# Patient Record
Sex: Female | Born: 1985 | Race: Black or African American | Hispanic: No | Marital: Single | Smoking: Former smoker
Health system: Southern US, Community
[De-identification: ages and names within clinical notes are randomized; demographics above are authoritative.]

---

## 2009-05-28 ENCOUNTER — Ambulatory Visit: Payer: Self-pay | Admitting: Diagnostic Radiology

## 2009-05-28 ENCOUNTER — Emergency Department (HOSPITAL_BASED_OUTPATIENT_CLINIC_OR_DEPARTMENT_OTHER): Admission: EM | Admit: 2009-05-28 | Discharge: 2009-05-28 | Payer: Self-pay | Admitting: Emergency Medicine

## 2010-05-14 LAB — URINALYSIS, ROUTINE W REFLEX MICROSCOPIC
Glucose, UA: NEGATIVE mg/dL
Protein, ur: NEGATIVE mg/dL
Urobilinogen, UA: 0.2 mg/dL (ref 0.0–1.0)
pH: 7 (ref 5.0–8.0)

## 2010-05-14 LAB — WET PREP, GENITAL
Trich, Wet Prep: NONE SEEN
WBC, Wet Prep HPF POC: NONE SEEN
Yeast Wet Prep HPF POC: NONE SEEN

## 2010-05-14 LAB — PREGNANCY, URINE: Preg Test, Ur: POSITIVE

## 2010-05-14 LAB — GC/CHLAMYDIA PROBE AMP, GENITAL
Chlamydia, DNA Probe: NEGATIVE
GC Probe Amp, Genital: NEGATIVE

## 2013-01-13 ENCOUNTER — Emergency Department (HOSPITAL_BASED_OUTPATIENT_CLINIC_OR_DEPARTMENT_OTHER)
Admission: EM | Admit: 2013-01-13 | Discharge: 2013-01-14 | Disposition: A | Discharge status: Home / Self Care | Payer: Medicaid Other | Attending: Emergency Medicine | Admitting: Emergency Medicine

## 2013-01-13 ENCOUNTER — Encounter (HOSPITAL_BASED_OUTPATIENT_CLINIC_OR_DEPARTMENT_OTHER): Payer: Self-pay | Admitting: Emergency Medicine

## 2013-01-13 DIAGNOSIS — L03039 Cellulitis of unspecified toe: Secondary | ICD-10-CM | POA: Insufficient documentation

## 2013-01-13 DIAGNOSIS — F17200 Nicotine dependence, unspecified, uncomplicated: Secondary | ICD-10-CM | POA: Insufficient documentation

## 2013-01-13 DIAGNOSIS — L02619 Cutaneous abscess of unspecified foot: Secondary | ICD-10-CM | POA: Insufficient documentation

## 2013-01-13 DIAGNOSIS — B353 Tinea pedis: Secondary | ICD-10-CM | POA: Insufficient documentation

## 2013-01-13 DIAGNOSIS — L0390 Cellulitis, unspecified: Secondary | ICD-10-CM

## 2013-01-13 MED ORDER — SULFAMETHOXAZOLE-TRIMETHOPRIM 800-160 MG PO TABS: 1.0000 | ORAL_TABLET | Freq: Two times a day (BID) | ORAL | Status: DC

## 2013-01-13 MED ORDER — TRAMADOL HCL 50 MG PO TABS
50.0000 mg | ORAL_TABLET | Freq: Once | ORAL | Status: AC
  Administered 2013-01-13: 50 mg via ORAL
  Filled 2013-01-13: qty 1

## 2013-01-13 MED ORDER — SULFAMETHOXAZOLE-TMP DS 800-160 MG PO TABS
1.0000 | ORAL_TABLET | Freq: Once | ORAL | Status: AC
  Administered 2013-01-13: 1 via ORAL
  Filled 2013-01-13: qty 1

## 2013-01-13 MED ORDER — TRAMADOL HCL 50 MG PO TABS: 50.0000 mg | ORAL_TABLET | Freq: Four times a day (QID) | ORAL | Status: DC | PRN

## 2013-01-13 NOTE — ED Notes (Signed)
Pain  And redness to rt foot x 5 days   No known injuyr

## 2013-01-13 NOTE — ED Notes (Signed)
MD at bedside. 

## 2013-01-13 NOTE — ED Provider Notes (Signed)
CSN: [id number]     Arrival date & time 01/13/13  2322 History  This chart was scribed for Pace Lamadrid Smitty Cords, MD by Ardelia Mems, ED Scribe. This patient was seen in room MH06/MH06 and the patient's care was started at 11:39 PM.   Chief Complaint  Patient presents with  . Foot Pain    Patient is a 27 y.o. female presenting with lower extremity pain. The history is provided by the patient. No language interpreter was used.  Foot Pain This is a new problem. The current episode started more than 2 days ago (5 days ago). The problem occurs rarely. The problem has been gradually worsening. Pertinent negatives include no chest pain, no abdominal pain, no headaches and no shortness of breath. The symptoms are aggravated by walking. Nothing relieves the symptoms. Treatments tried: warm Epsom's salt bath. The treatment provided no relief.    HPI Comments: Angelica Sandoval is a 27 y.o. female who presents to the Emergency Department complaining of intermittent, mild right dorsal foot pain, mainly at the base of her 4th and 5th toes, over the past 5 days. She describes her pain as "soreness", and as "burninig" when she iswalking. She reports associated redness to the foot. She denies any known injury to have onset this pain. She states that she has soaked the right foot in a warm Epsom's salt bath without relief. She denies any other pain or symptoms.   History reviewed. No pertinent past medical history. No past surgical history on file. No family history on file. History  Substance Use Topics  . Smoking status: Light Tobacco Smoker  . Smokeless tobacco: Not on file  . Alcohol Use: Yes   OB History   Grav Para Term Preterm Abortions TAB SAB Ect Mult Living                 Review of Systems  Constitutional: Negative for fever.  Respiratory: Negative for shortness of breath.   Cardiovascular: Negative for chest pain.  Gastrointestinal: Negative for abdominal pain.  Musculoskeletal:        Right foot pain  Neurological: Negative for headaches.  All other systems reviewed and are negative.   Allergies  Review of patient's allergies indicates no known allergies.  Home Medications   Current Outpatient Rx  Name  Route  Sig  Dispense  Refill  . sulfamethoxazole-trimethoprim (BACTRIM DS,SEPTRA DS) 800-160 MG per tablet   Oral   Take 1 tablet by mouth 2 (two) times daily. One po bid x 7 days   6 tablet   0   . traMADol (ULTRAM) 50 MG tablet   Oral   Take 1 tablet (50 mg total) by mouth every 6 (six) hours as needed.   9 tablet   0     Triage Vitals: BP 121/71  Pulse 102  Temp(Src) 98.8 F (37.1 C) (Oral)  Resp 16  Ht 5\' 5"  (1.651 m)  Wt 165 lb (74.844 kg)  BMI 27.46 kg/m2  SpO2 99%  Physical Exam  Nursing note and vitals reviewed. Constitutional: She is oriented to person, place, and time. She appears well-developed and well-nourished. No distress.  HENT:  Head: Normocephalic and atraumatic.  Mouth/Throat: Oropharynx is clear and moist.  Moist mucous membranes.  Eyes: EOM are normal. Pupils are equal, round, and reactive to light.  Neck: Normal range of motion. Neck supple. No tracheal deviation present.  Cardiovascular: Normal rate, regular rhythm and intact distal pulses.   Intact DP pulse. Cap refill less  than 2 seconds for the right foot.  Pulmonary/Chest: Effort normal and breath sounds normal. No respiratory distress. She has no wheezes. She has no rales. She exhibits no tenderness.  Abdominal: Soft. Bowel sounds are normal. There is no tenderness. There is no rebound and no guarding.  Musculoskeletal: Normal range of motion.  Neurological: She is alert and oriented to person, place, and time.  Right foot NVI.  Skin: Skin is warm and dry.  1.5 inch area of redness/cellulitis cracking between the 4th and 5th toes on the right foot, probably brought in by the cracking between the toes which has some athlete's foot.  Psychiatric: She has a normal mood  and affect. Her behavior is normal.    ED Course  Procedures (including critical care time)  DIAGNOSTIC STUDIES: Oxygen Saturation is 99% on RA, normal by my interpretation.    COORDINATION OF CARE: 11:45 PM- Discussed clinical suspicion that pt has athlete's foot. Advised pt on how to manage her symptoms at home. Pt advised of plan for treatment and pt agrees.  Labs Review Labs Reviewed - No data to display Imaging Review No results found.  EKG Interpretation   None       MDM   1. Cellulitis   2. Athlete's foot on right    Will treat with antibiotics and have advised anti fungal spray.  Return for fevers worsening swelling or redness or any concerns   I personally performed the services described in this documentation, which was scribed in my presence. The recorded information has been reviewed and is accurate.     Jasmine Awe, MD 01/14/13 [phone]

## 2014-06-01 ENCOUNTER — Encounter (HOSPITAL_BASED_OUTPATIENT_CLINIC_OR_DEPARTMENT_OTHER): Payer: Self-pay | Admitting: *Deleted

## 2014-06-01 ENCOUNTER — Emergency Department (HOSPITAL_BASED_OUTPATIENT_CLINIC_OR_DEPARTMENT_OTHER)
Admission: EM | Admit: 2014-06-01 | Discharge: 2014-06-01 | Disposition: A | Discharge status: Home / Self Care | Payer: BC Managed Care – PPO | Attending: Emergency Medicine | Admitting: Emergency Medicine

## 2014-06-01 DIAGNOSIS — Z87891 Personal history of nicotine dependence: Secondary | ICD-10-CM | POA: Insufficient documentation

## 2014-06-01 DIAGNOSIS — Z3202 Encounter for pregnancy test, result negative: Secondary | ICD-10-CM | POA: Insufficient documentation

## 2014-06-01 DIAGNOSIS — R112 Nausea with vomiting, unspecified: Secondary | ICD-10-CM | POA: Insufficient documentation

## 2014-06-01 DIAGNOSIS — Z792 Long term (current) use of antibiotics: Secondary | ICD-10-CM | POA: Diagnosis not present

## 2014-06-01 DIAGNOSIS — R42 Dizziness and giddiness: Secondary | ICD-10-CM | POA: Diagnosis present

## 2014-06-01 LAB — URINALYSIS, ROUTINE W REFLEX MICROSCOPIC
BILIRUBIN URINE: NEGATIVE
Glucose, UA: NEGATIVE mg/dL
Ketones, ur: NEGATIVE mg/dL
Leukocytes, UA: NEGATIVE
NITRITE: NEGATIVE
PH: 7.5 (ref 5.0–8.0)
Protein, ur: NEGATIVE mg/dL
SPECIFIC GRAVITY, URINE: 1.021 (ref 1.005–1.030)
Urobilinogen, UA: 1 mg/dL (ref 0.0–1.0)

## 2014-06-01 LAB — URINE MICROSCOPIC-ADD ON

## 2014-06-01 LAB — PREGNANCY, URINE: Preg Test, Ur: NEGATIVE

## 2014-06-01 MED ORDER — SODIUM CHLORIDE 0.9 % IV BOLUS (SEPSIS): 1000.0000 mL | Freq: Once | INTRAVENOUS | Status: DC

## 2014-06-01 NOTE — ED Provider Notes (Signed)
CSN: [id number]     Arrival date & time 06/01/14  1134 History   First MD Initiated Contact with Patient 06/01/14 1241     Chief Complaint  Patient presents with  . Dizziness     (Consider location/radiation/quality/duration/timing/severity/associated sxs/prior Treatment) HPI  This is a [age] year old female who presents with dizziness, nausea, and vomiting. Patient had one episode of nausea and emesis while at work. Emesis was nonbloody, non-bilious. Patient states that she felt very hot during that time. She reports feeling off balance but no room spinning dizziness. First responders took her blood pressure and found her blood pressure to be 151/103 following emesis. Patient denies any recent illnesses, fevers, chest pain, shortness breath, abdominal pain, urinary symptoms. Last menstrual period was the third week in March.  Patient reports that she is now back to her baseline. No known history of high blood pressure. Blood pressure in triage 126/75.  History reviewed. No pertinent past medical history. Past Surgical History  Procedure Laterality Date  . Cesarean section     No family history on file. History  Substance Use Topics  . Smoking status: Former Games developermoker  . Smokeless tobacco: Not on file  . Alcohol Use: Yes   OB History    No data available     Review of Systems  Constitutional: Negative for fever.  Respiratory: Negative for cough, chest tightness and shortness of breath.   Cardiovascular: Negative for chest pain.  Gastrointestinal: Positive for nausea and vomiting. Negative for abdominal pain.  Genitourinary: Negative for dysuria and frequency.  Musculoskeletal: Negative for back pain.  Neurological: Positive for dizziness. Negative for headaches.  Psychiatric/Behavioral: Negative for confusion.  All other systems reviewed and are negative.     Allergies  Review of patient's allergies indicates no known allergies.  Home Medications   Prior to Admission  medications   Medication Sig Start Date End Date Taking? Authorizing Provider  sulfamethoxazole-trimethoprim (BACTRIM DS,SEPTRA DS) 800-160 MG per tablet Take 1 tablet by mouth 2 (two) times daily. One po bid x 7 days 01/13/13   April Palumbo, MD  traMADol (ULTRAM) 50 MG tablet Take 1 tablet (50 mg total) by mouth every 6 (six) hours as needed. 01/13/13   April Palumbo, MD   BP 133/88 mmHg  Pulse 67  Temp(Src) 98.2 F (36.8 C) (Oral)  Resp 16  Ht 5\' 5"  (1.651 m)  Wt 174 lb (78.926 kg)  BMI 28.96 kg/m2  SpO2 100%  LMP 05/15/2014 (Approximate) Physical Exam  Constitutional: She is oriented to person, place, and time. She appears well-developed and well-nourished. No distress.  HENT:  Head: Normocephalic and atraumatic.  Eyes: Pupils are equal, round, and reactive to light.  Cardiovascular: Normal rate, regular rhythm and normal heart sounds.   No murmur heard. Pulmonary/Chest: Effort normal and breath sounds normal. No respiratory distress. She has no wheezes.  Abdominal: Soft. Bowel sounds are normal. There is no tenderness. There is no rebound.  Neurological: She is alert and oriented to person, place, and time.  No dysmetria to finger-nose-finger, 5 out of 5 strength in all 4 extremities  Skin: Skin is warm and dry.  Psychiatric: She has a normal mood and affect.  Nursing note and vitals reviewed.   ED Course  Procedures (including critical care time) Labs Review Labs Reviewed  URINALYSIS, ROUTINE W REFLEX MICROSCOPIC - Abnormal; Notable for the following:    APPearance TURBID (*)    Hgb urine dipstick TRACE (*)    All other components within normal  limits  PREGNANCY, URINE  URINE MICROSCOPIC-ADD ON    Imaging Review No results found.   EKG Interpretation   Date/Time:  Friday June 01 2014 13:54:18 EDT Ventricular Rate:  68 PR Interval:  122 QRS Duration: 84 QT Interval:  394 QTC Calculation: 418 R Axis:   43 Text Interpretation:  Sinus rhythm with occasional  Premature ventricular  complexes Otherwise normal ECG ED PHYSICIAN INTERPRETATION AVAILABLE IN  [facility] Confirmed by TEST, Record ([id number]) on 06/02/2014 9:39:38 AM      MDM   Final diagnoses:  Dizziness    Patient presents with nausea, vomiting, and one episode of dizziness. Episode appears self-limited. Blood pressure following event was elevated and patient was instructed to come to the ER. Repeat blood pressure here is normal. Patient is currently asymptomatic. Physical exam is benign and neurologic exam is reassuring. Patient is not orthostatic. EKG shows no evidence of arrhythmia or interval prolongation. Patient is not pregnant. 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 Have offered the patient fluids. She is refusing fluids and wants to drink by mouth.  After history, exam, and medical workup I feel the patient has been appropriately medically screened and is safe for discharge home. Pertinent diagnoses were discussed with the patient. Patient was given return precautions.     Shon Baton, MD 06/04/14 [phone]

## 2014-06-01 NOTE — ED Notes (Signed)
Pt refusing IV. Pt states she just wants to drink water

## 2014-06-01 NOTE — Discharge Instructions (Signed)

## 2014-06-01 NOTE — ED Notes (Signed)
Pt sts she was at work when she suddenly became nausea and had 1 episode of emesis. She sts that she has been intermittently dizzy and has had intermittent chills. She sts they took her bp and found it to be 151/103.

## 2014-12-24 ENCOUNTER — Encounter (HOSPITAL_BASED_OUTPATIENT_CLINIC_OR_DEPARTMENT_OTHER): Payer: Self-pay

## 2014-12-24 ENCOUNTER — Emergency Department (HOSPITAL_BASED_OUTPATIENT_CLINIC_OR_DEPARTMENT_OTHER)
Admission: EM | Admit: 2014-12-24 | Discharge: 2014-12-24 | Disposition: A | Discharge status: Home / Self Care | Payer: BC Managed Care – PPO | Attending: Emergency Medicine | Admitting: Emergency Medicine

## 2014-12-24 DIAGNOSIS — Y9389 Activity, other specified: Secondary | ICD-10-CM | POA: Insufficient documentation

## 2014-12-24 DIAGNOSIS — Y9289 Other specified places as the place of occurrence of the external cause: Secondary | ICD-10-CM | POA: Insufficient documentation

## 2014-12-24 DIAGNOSIS — Y998 Other external cause status: Secondary | ICD-10-CM | POA: Insufficient documentation

## 2014-12-24 DIAGNOSIS — Z23 Encounter for immunization: Secondary | ICD-10-CM | POA: Insufficient documentation

## 2014-12-24 DIAGNOSIS — W228XXA Striking against or struck by other objects, initial encounter: Secondary | ICD-10-CM | POA: Insufficient documentation

## 2014-12-24 DIAGNOSIS — F17200 Nicotine dependence, unspecified, uncomplicated: Secondary | ICD-10-CM | POA: Insufficient documentation

## 2014-12-24 DIAGNOSIS — S61011A Laceration without foreign body of right thumb without damage to nail, initial encounter: Secondary | ICD-10-CM | POA: Insufficient documentation

## 2014-12-24 MED ORDER — LIDOCAINE HCL (PF) 1 % IJ SOLN
10.0000 mL | Freq: Once | INTRAMUSCULAR | Status: AC
  Administered 2014-12-24: 10 mL
  Filled 2014-12-24: qty 10

## 2014-12-24 MED ORDER — TETANUS-DIPHTH-ACELL PERTUSSIS 5-2.5-18.5 LF-MCG/0.5 IM SUSP
0.5000 mL | Freq: Once | INTRAMUSCULAR | Status: AC
  Administered 2014-12-24: 0.5 mL via INTRAMUSCULAR
  Filled 2014-12-24: qty 0.5

## 2014-12-24 NOTE — ED Provider Notes (Signed)
CSN: [id number]     Arrival date & time 12/24/14  1321 History   First MD Initiated Contact with Patient 12/24/14 1411     Chief Complaint  Patient presents with  . Finger Injury    HPI   Angelica Sandoval is a 29 y.o. female with no pertinent PMH who presents to the ED with right thumb laceration. She states she was cleaning out her car and hit her right thumb. She reports associated pain. She denies exacerbating factors. She has not tried anything for symptom relief. She denies significant blood loss. She denies numbness, weakness, paresthesia. She states she is unsure if her tetanus is up-to-date.   History reviewed. No pertinent past medical history. Past Surgical History  Procedure Laterality Date  . Cesarean section     No family history on file. Social History  Substance Use Topics  . Smoking status: Current Every Day Smoker  . Smokeless tobacco: None  . Alcohol Use: Yes     Comment: occ   OB History    No data available      Review of Systems  Skin: Positive for wound.  Neurological: Negative for weakness and numbness.      Allergies  Review of patient's allergies indicates no known allergies.  Home Medications   Prior to Admission medications   Not on File    BP 128/82 mmHg  Pulse 80  Temp(Src) 98.2 F (36.8 C) (Oral)  Resp 16  Ht  (1.651 m)  Wt 171 lb (77.565 kg)  BMI 28.46 kg/m2  SpO2 100%  LMP  (LMP Unknown) Physical Exam  Constitutional: She is oriented to person, place, and time. She appears well-developed and well-nourished. No distress.  HENT:  Head: Normocephalic and atraumatic.  Right Ear: External ear normal.  Left Ear: External ear normal.  Nose: Nose normal.  Mouth/Throat: Oropharynx is clear and moist.  Eyes: Conjunctivae and EOM are normal. Pupils are equal, round, and reactive to light. Right eye exhibits no discharge. Left eye exhibits no discharge. No scleral icterus.  Neck: Normal range of motion. Neck supple.   Cardiovascular: Normal rate and regular rhythm.   Pulmonary/Chest: Effort normal and breath sounds normal. No respiratory distress.  Musculoskeletal: Normal range of motion. She exhibits tenderness. She exhibits no edema.  2 cm laceration to right thumb over PIP, hemostatic. Mild tenderness to palpation. Full range of motion of right hand. Strength and sensation intact. Distal pulses intact. Cap refill less than 3 seconds.  Neurological: She is alert and oriented to person, place, and time.  Skin: Skin is warm and dry. She is not diaphoretic.  Psychiatric: She has a normal mood and affect. Her behavior is normal.  Nursing note and vitals reviewed.   ED Course  .Marland KitchenLaceration Repair Date/Time: 12/24/2014 3:18 PM Performed by: Mady Gemma Authorized by: Glean Hess C Consent: Verbal consent obtained. Risks and benefits: risks, benefits and alternatives were discussed Consent given by: patient Patient understanding: patient states understanding of the procedure being performed Patient consent: the patient's understanding of the procedure matches consent given Procedure consent: procedure consent matches procedure scheduled Relevant documents: relevant documents present and verified Site marked: the operative site was marked Required items: required blood products, implants, devices, and special equipment available Patient identity confirmed: verbally with patient Time out: Immediately prior to procedure a "time out" was called to verify the correct patient, procedure, equipment, support staff and site/side marked as required. Body area: upper extremity Location details: right thumb Laceration length:  2 cm Foreign bodies: no foreign bodies Tendon involvement: none Nerve involvement: none Vascular damage: no Anesthesia: local infiltration Local anesthetic: lidocaine 1% without epinephrine Anesthetic total: 6 ml Patient sedated: no Preparation: Patient was prepped and  draped in the usual sterile fashion. Irrigation solution: saline Irrigation method: tap Amount of cleaning: standard Debridement: none Degree of undermining: none Skin closure: 4-0 Prolene Number of sutures: 2 Technique: simple Approximation: close Approximation difficulty: simple Dressing: 4x4 sterile gauze Patient tolerance: Patient tolerated the procedure well with no immediate complications   Labs Review Labs Reviewed - No data to display  Imaging Review No results found.     EKG Interpretation None      MDM   Final diagnoses:  Thumb laceration, right, initial encounter    29 year old female presents with right thumb laceration. Denies numbness, weakness, paresthesia. Reports mild pain. States she is unsure of her tetanus is up-to-date. Patient is afebrile. Vital signs stable. 2 cm laceration overlying PIP of right thumb, hemostatic. Full range of motion of right hand. Strength and sensation intact. Distal pulses intact. Cap refill less than 3 seconds. Laceration cleaned, repaired, and dressed in the emergency department. Tetanus updated. Patient stable for discharge at this time. Instructed to follow-up in one week for suture removal. Return precautions discussed.  BP 128/82 mmHg  Pulse 80  Temp(Src) 98.2 F (36.8 C) (Oral)  Resp 16  Ht 5\' 5"  (1.651 m)  Wt 171 lb (77.565 kg)  BMI 28.46 kg/m2  SpO2 100%  LMP  (LMP Unknown)    Mady Gemmalizabeth C Maleaha Hughett, PA-C 12/24/14 1614  Arby BarretteMarcy Pfeiffer, MD 12/25/14 [phone]

## 2014-12-24 NOTE — Discharge Instructions (Signed)
1. Medications: usual home medications 2. Treatment: rest, drink plenty of fluids 3. Follow Up: please followup with your primary doctor in 1 week for suture removal; if you do not have a primary care doctor use the resource guide provided to find one; please return to the ER for fever, signs of infection (redness, swelling, heat, discharge from finger)   Laceration Care, Adult A laceration is a cut that goes through all layers of the skin. The cut also goes into the tissue that is right under the skin. Some cuts heal on their own. Others need to be closed with stitches (sutures), staples, skin adhesive strips, or wound glue. Taking care of your cut lowers your risk of infection and helps your cut to heal better. HOW TO TAKE CARE OF YOUR CUT For stitches or staples:  Keep the wound clean and dry.  If you were given a bandage (dressing), you should change it at least one time per day or as told by your doctor. You should also change it if it gets wet or dirty.  Keep the wound completely dry for the first 24 hours or as told by your doctor. After that time, you may take a shower or a bath. However, make sure that the wound is not soaked in water until after the stitches or staples have been removed.  Clean the wound one time each day or as told by your doctor:  Wash the wound with soap and water.  Rinse the wound with water until all of the soap comes off.  Pat the wound dry with a clean towel. Do not rub the wound.  After you clean the wound, put a thin layer of antibiotic ointment on it as told by your doctor. This ointment:  Helps to prevent infection.  Keeps the bandage from sticking to the wound.  Have your stitches or staples removed as told by your doctor. If your doctor used skin adhesive strips:   Keep the wound clean and dry.  If you were given a bandage, you should change it at least one time per day or as told by your doctor. You should also change it if it gets dirty or  wet.  Do not get the skin adhesive strips wet. You can take a shower or a bath, but be careful to keep the wound dry.  If the wound gets wet, pat it dry with a clean towel. Do not rub the wound.  Skin adhesive strips fall off on their own. You can trim the strips as the wound heals. Do not remove any strips that are still stuck to the wound. They will fall off after a while. If your doctor used wound glue:  Try to keep your wound dry, but you may briefly wet it in the shower or bath. Do not soak the wound in water, such as by swimming.  After you take a shower or a bath, gently pat the wound dry with a clean towel. Do not rub the wound.  Do not do any activities that will make you really sweaty until the skin glue has fallen off on its own.  Do not apply liquid, cream, or ointment medicine to your wound while the skin glue is still on.  If you were given a bandage, you should change it at least one time per day or as told by your doctor. You should also change it if it gets dirty or wet.  If a bandage is placed over the wound, do  not let the tape for the bandage touch the skin glue.  Do not pick at the glue. The skin glue usually stays on for 5-10 days. Then, it falls off of the skin. General Instructions  To help prevent scarring, make sure to cover your wound with sunscreen whenever you are outside after stitches are removed, after adhesive strips are removed, or when wound glue stays in place and the wound is healed. Make sure to wear a sunscreen of at least 30 SPF.  Take over-the-counter and prescription medicines only as told by your doctor.  If you were given antibiotic medicine or ointment, take or apply it as told by your doctor. Do not stop using the antibiotic even if your wound is getting better.  Do not scratch or pick at the wound.  Keep all follow-up visits as told by your doctor. This is important.  Check your wound every day for signs of infection. Watch  for:  Redness, swelling, or pain.  Fluid, blood, or pus.  Raise (elevate) the injured area above the level of your heart while you are sitting or lying down, if possible. GET HELP IF:  You got a tetanus shot and you have any of these problems at the injection site:  Swelling.  Very bad pain.  Redness.  Bleeding.  You have a fever.  A wound that was closed breaks open.  You notice a bad smell coming from your wound or your bandage.  You notice something coming out of the wound, such as wood or glass.  Medicine does not help your pain.  You have more redness, swelling, or pain at the site of your wound.  You have fluid, blood, or pus coming from your wound.  You notice a change in the color of your skin near your wound.  You need to change the bandage often because fluid, blood, or pus is coming from the wound.  You start to have a new rash.  You start to have numbness around the wound. GET HELP RIGHT AWAY IF:  You have very bad swelling around the wound.  Your pain suddenly gets worse and is very bad.  You notice painful lumps near the wound or on skin that is anywhere on your body.  You have a red streak going away from your wound.  The wound is on your hand or foot and you cannot move a finger or toe like you usually can.  The wound is on your hand or foot and you notice that your fingers or toes look pale or bluish.   This information is not intended to replace advice given to you by your health care provider. Make sure you discuss any questions you have with your health care provider.   Document Released: 07/29/2007 Document Revised: 06/26/2014 Document Reviewed: 02/05/2014 Elsevier Interactive Patient Education 2016 [employer].   Emergency Department Resource Guide 1) Find a Doctor and Pay Out of Pocket Although you won't have to find out who is covered by your insurance plan, it is a good idea to ask around and get recommendations. You will then  need to call the office and see if the doctor you have chosen will accept you as a new patient and what types of options they offer for patients who are self-pay. Some doctors offer discounts or will set up payment plans for their patients who do not have insurance, but you will need to ask so you aren't surprised when you get to your appointment.  2)  Contact Your Local Health Department Not all health departments have doctors that can see patients for sick visits, but many do, so it is worth a call to see if yours does. If you don't know where your local health department is, you can check in your phone book. The CDC also has a tool to help you locate your state's health department, and many state websites also have listings of all of their local health departments.  3) Find a Walk-in Clinic If your illness is not likely to be very severe or complicated, you may want to try a walk in clinic. These are popping up all over the country in pharmacies, drugstores, and shopping centers. They're usually staffed by nurse practitioners or physician assistants that have been trained to treat common illnesses and complaints. They're usually fairly quick and inexpensive. However, if you have serious medical issues or chronic medical problems, these are probably not your best option.  No Primary Care Doctor: - Call Health Connect at  [phone] - they can help you locate a primary care doctor that  accepts your insurance, provides certain services, etc. - Physician Referral Service- [phone]  Chronic Pain Problems: Organization         Address  Phone   Notes  [facility]  [phone] Patients need to be referred by their primary care doctor.   Medication Assistance: Organization         Address  Phone   Notes  [facility] Medication [facility] [address] [place], [place] [address] [phone] --Must be a resident of [facility] -- Must have NO  insurance coverage whatsoever (no Medicaid/ Medicare, etc.) -- The pt. MUST have a primary care doctor that directs their care regularly and follows them in the community   [facility]  [phone]   [employer]  [phone]    Agencies that provide inexpensive medical care: Organization         Address  Phone   Notes  [facility]  [phone]   [facility]    [phone]   [facility] [address] [place], [place] [address] [phone]   [facility] [address] [place], [place] [phone]   Planned Parenthood    [phone]   [facility]    [phone]   [facility]  [address], [place] Phone:  [phone], Fax:  [phone] Hours of Operation:  9 am - 6 pm, M-F.  Also accepts Medicaid/Medicare and self-pay.  [facility]  [address], [place] Phone: [phone], Fax: [phone]. Hours of Operation:  8:30 am - 5:30 pm, M-F.  Also accepts Medicaid and self-pay.  [facility] [place] [address], [place] Phone: [phone]   [facility] [address] Natasha Bence [place], [place] [phone], Ext. [phone] Mondays & Thursdays: 7-9 AM.  First 15 patients are seen on a first come, first serve basis.    Medicaid-accepting [facility] Providers:  Organization         Address  Phone   Notes  [facility] [address],  [phone] Also accepts self-pay patients.  [facility] [address] Laurell Josephs [place], [place]  [phone]   [facility] [address], [address] (  [phone]   [facility] [address], [place] [phone]   Renaye Rakers [address], [place]   [phone] Only accepts [place] Access [place] patients after they have  their name applied to their card.   Self-Pay (no insurance) in [facility]:  Organization         Address  Phone   Notes  Sickle Cell Patients, [facility] [address] [place], [place] [phone]   [facility] [address] [place], [place] [phone]   [facility]  [address],  [phone]   [facility]/Dr. Osei-Bonsu  [address], [place] or [address], [place] [phone] Phone number for both [place] and [place] locations is the same.  Urgent Medical and [facility] [address] [place] [phone]   [facility] [address] [place] or [address] [phone]   [facility] [address] [place] [phone], phone; [phone], fax Sees patients 1st and 3rd Saturday of every month.  Must not qualify for public or private insurance (i.e. Medicaid, Medicare, [place] Health Choice, Veterans' Benefits)  Household income should be no more than 200% of the poverty level The clinic cannot treat you if you are pregnant or think you are pregnant  Sexually transmitted diseases are not treated at the clinic.    Dental Care: Organization         Address  Phone  Notes  [facility] of [facility] [address] [place], [place] [phone] Accepts children up to age 69 who are enrolled in [place] or [place] Health Choice; pregnant women with a Medicaid card; and children who have applied for Medicaid or [place] Health Choice, but were declined, whose parents can pay a reduced fee at time of service.  [facility] of [facility]  [address], [place] [phone] Accepts children up to age 52 who are enrolled in [place] or [place] Health Choice; pregnant women with a Medicaid card; and children who have applied  for Medicaid or  Health Choice, but were declined, whose parents can pay a reduced fee at time of service.  [facility]  [address] [place], [place] [phone] Patients are seen by appointment only. Walk-ins are not accepted. [facility] will see patients 39 years of age and older. Monday - Tuesday (8am-5pm) Most Wednesdays (8:30-5pm) $30 per visit, cash only  [facility] Adult Dental Access PRO[facility]  [address], [facility] [phone] Patients are seen by appointment only. Walk-ins are not accepted. [facility] will see patients 57 years of age and older. One Wednesday Evening (Monthly: Volunteer Based).  $30 per visit, cash only  [employer]  [phone] for adults; Children under age 60, call Graduate Pediatric Dentistry at [phone]. Children aged 54-14, please call [phone] to request a pediatric application.  Dental services are provided in all areas of dental care including fillings, crowns and bridges, complete and partial dentures, implants, gum treatment, root canals, and extractions. Preventive care is also provided. Treatment is provided to both adults and children. Patients are selected via a lottery and there is often a waiting list.   [facility] [address], [place] Otto  [phone] F7213086 [email or web address]   [facility] [address] [place], [place] [phone], Ext. [phone] Second and Fourth Thursday of each month, opens at 6:30 AM; Clinic ends at 9 AM.  Patients are seen on a first-come first-served basis, and a limited number are seen during each clinic.   [facility]  [address] Ether Griffins [place], [place] [phone]   Eligibility Requirements You must have lived in [place], [place], or [place] counties for at least the last three months.   You cannot be eligible for state or federal sponsored [place], including [employer],  [place], or [employer].   You generally cannot be eligible for healthcare insurance through your employer.    How to apply: Eligibility screenings are held every Tuesday and Wednesday afternoon from 1:00 pm until 4:00 pm. You do not need an appointment for the interview!  [facility] [address], [place], [place] [phone]   [facility]  [phone]   [facility]  [phone]   [facility]  [phone]    Behavioral Health Resources in the Community: Intensive Outpatient Programs Organization         Address  Phone  Notes  [facility] Services [address], [place], [place] [phone]   [facility] Outpatient [address] [place], [place] [phone]   [facility] [address], [place], [place]  [phone]   [facility] Mental Health [address],  [place], [place] [phone] or [phone]   Substance Abuse Resources Organization         Address  Phone  Notes  [facility]  [phone]   [facility]  [phone]   [facility]  [phone]   Floydene Flock  [phone]   Residential & Outpatient Substance Abuse Program  [phone]   Psychological Services Organization         Address  Phone  Notes  [facility] Behavioral Health  [phone]   [facility] Services  [phone]   [facility] [address] [place] [phone] or [phone]    Mobile Crisis Teams Organization         Address  Phone  Notes  [facility], Mobile Crisis Care Unit  [phone]   [facility]  [address] [place], [place] [phone]   Doristine Locks [address] [place] [phone]    Self-Help/Support Groups Organization         Address  Phone             Notes  [facility] [place] - variety of support  groups  336- I7437963 Call for more information  Narcotics Anonymous (NA), [facility] [address], [employer] [place]  2 meetings at this location   Statistician         Address  Phone  Notes  [facility] [address],    [place]  [phone]   [facility]  [address], [place] [address], [place], [place] [phone]   [facility] [address] [place], [place] [phone] Admissions: 8am-3pm M-F  [facility] [address],    [place], [place] [phone]   [facility] [address] Starling Manns [place], [place] [phone]   [facility] [address]  [place], [place] [phone]   [facility] -  Intensive Outpatient [address] Laurell Josephs [address], [place], [place] [phone]   [facility] ([facility] [address] [place]  [place], [place] [phone] or [phone]   [facility] ([facility]) [address] [place], [place] [phone] Accepts Medicaid  [facility] [address],  [place] [phone] Substance Abuse/Addiction Treatment   [facility] Organization         Address  Phone  Notes  [facility]  ([phone]   Angie Fava, PhD [address] Ervin Knack [place], [place]   [phone] or [phone]   [facility] Behavioral   [address] [place], [place] [phone]   [facility] [address] [place], [place] [phone] Insurance/Medicaid/sponsorship through [facility] [address]                                    [place], [place] [phone] Therapy/tele-psych/case  [facility] [address], [place] [phone]    Dr. Lolly Mustache  [phone]   [facility]  [employer] [facility] Dept. 1) [address] [place] 2) [address], [place] 3)   [address], [place] [phone]  [phone]   [facility] Child Abuse Hotline (281)297-4947 or [phone] (After Hours)
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
## 2014-12-24 NOTE — ED Notes (Signed)
EDP at bedside suturing patient 
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
## 2015-12-12 ENCOUNTER — Encounter (HOSPITAL_BASED_OUTPATIENT_CLINIC_OR_DEPARTMENT_OTHER): Payer: Self-pay | Admitting: *Deleted

## 2015-12-12 ENCOUNTER — Emergency Department (HOSPITAL_BASED_OUTPATIENT_CLINIC_OR_DEPARTMENT_OTHER)
Admission: EM | Admit: 2015-12-12 | Discharge: 2015-12-12 | Disposition: A | Discharge status: Home / Self Care | Payer: Medicaid Other | Attending: Emergency Medicine | Admitting: Emergency Medicine

## 2015-12-12 ENCOUNTER — Emergency Department (HOSPITAL_BASED_OUTPATIENT_CLINIC_OR_DEPARTMENT_OTHER): Payer: Medicaid Other

## 2015-12-12 DIAGNOSIS — Y929 Unspecified place or not applicable: Secondary | ICD-10-CM | POA: Insufficient documentation

## 2015-12-12 DIAGNOSIS — Y999 Unspecified external cause status: Secondary | ICD-10-CM | POA: Insufficient documentation

## 2015-12-12 DIAGNOSIS — F17200 Nicotine dependence, unspecified, uncomplicated: Secondary | ICD-10-CM | POA: Insufficient documentation

## 2015-12-12 DIAGNOSIS — X58XXXA Exposure to other specified factors, initial encounter: Secondary | ICD-10-CM | POA: Insufficient documentation

## 2015-12-12 DIAGNOSIS — Y939 Activity, unspecified: Secondary | ICD-10-CM | POA: Insufficient documentation

## 2015-12-12 DIAGNOSIS — S93601A Unspecified sprain of right foot, initial encounter: Secondary | ICD-10-CM | POA: Insufficient documentation

## 2015-12-12 MED ORDER — IBUPROFEN 800 MG PO TABS: 800.0000 mg | ORAL_TABLET | Freq: Three times a day (TID) | ORAL | 0 refills | Status: AC | PRN

## 2015-12-12 MED ORDER — IBUPROFEN 800 MG PO TABS
800.0000 mg | ORAL_TABLET | Freq: Once | ORAL | Status: AC
  Administered 2015-12-12: 800 mg via ORAL
  Filled 2015-12-12: qty 1

## 2015-12-12 NOTE — ED Notes (Signed)
Pt verbalizes understanding of d/c instructions and denies any further needs at this time. 
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
## 2017-05-03 ENCOUNTER — Emergency Department (HOSPITAL_BASED_OUTPATIENT_CLINIC_OR_DEPARTMENT_OTHER)
Admission: EM | Admit: 2017-05-03 | Discharge: 2017-05-03 | Disposition: A | Discharge status: Home / Self Care | Payer: BC Managed Care – PPO | Attending: Emergency Medicine | Admitting: Emergency Medicine

## 2017-05-03 ENCOUNTER — Other Ambulatory Visit: Payer: Self-pay

## 2017-05-03 ENCOUNTER — Encounter (HOSPITAL_BASED_OUTPATIENT_CLINIC_OR_DEPARTMENT_OTHER): Payer: Self-pay | Admitting: *Deleted

## 2017-05-03 DIAGNOSIS — R112 Nausea with vomiting, unspecified: Secondary | ICD-10-CM | POA: Insufficient documentation

## 2017-05-03 DIAGNOSIS — F17210 Nicotine dependence, cigarettes, uncomplicated: Secondary | ICD-10-CM | POA: Insufficient documentation

## 2017-05-03 DIAGNOSIS — K529 Noninfective gastroenteritis and colitis, unspecified: Secondary | ICD-10-CM

## 2017-05-03 MED ORDER — ONDANSETRON 4 MG PO TBDP: 4.0000 mg | ORAL_TABLET | Freq: Three times a day (TID) | ORAL | 0 refills | Status: AC | PRN

## 2017-05-03 MED ORDER — ONDANSETRON 4 MG PO TBDP
4.0000 mg | ORAL_TABLET | Freq: Once | ORAL | Status: AC
  Administered 2017-05-03: 4 mg via ORAL
  Filled 2017-05-03: qty 1

## 2017-05-03 NOTE — ED Provider Notes (Signed)
[facility] EMERGENCY DEPARTMENT Provider Note   CSN: [id number] Arrival date & time: 05/03/17  1448     History   Chief Complaint Chief Complaint  Patient presents with  . Emesis  . Diarrhea    HPI Angelica Sandoval is a 32 y.o. female without significant past medical history, presenting to the ED with acute onset of nausea, vomiting, and diarrhea since last night.  Patient states she has had a couple episodes of nonbloody nonbilious emesis, and associated diarrhea.  Last episode of emesis was about 1 PM today.  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  Denies fever, urinary symptoms, pelvic complaints.  She reports today with her son, who has similar symptoms.  The history is provided by the patient.    History reviewed. No pertinent past medical history.  There are no active problems to display for this patient.   Past Surgical History:  Procedure Laterality Date  . CESAREAN SECTION      OB History    No data available       Home Medications    Prior to Admission medications   Medication Sig Start Date End Date Taking? Authorizing Provider  ibuprofen (ADVIL,MOTRIN) 800 MG tablet Take 1 tablet (800 mg total) by mouth every 8 (eight) hours as needed. 12/12/15   Mesner, Barbara Cower, MD    Family History No family history on file.  Social History Social History   Tobacco Use  . Smoking status: Current Every Day Smoker  . Smokeless tobacco: Never Used  Substance Use Topics  . Alcohol use: Yes    Comment: occ  . Drug use: No     Allergies   Patient has no known allergies.   Review of Systems Review of Systems  Constitutional: Negative for fever.  Gastrointestinal: Positive for abdominal pain, diarrhea, nausea and vomiting. Negative for blood in stool and constipation.  Genitourinary: Negative for dysuria, frequency, vaginal bleeding and vaginal discharge.  All other  systems reviewed and are negative.    Physical Exam Updated Vital Signs BP 122/82   Pulse 92   Temp 98.1 F (36.7 C) (Oral)   Resp 20   Ht 5\' 5"  (1.651 m)   Wt 77.1 kg (170 lb)   LMP 04/26/2017   SpO2 99%   BMI 28.29 kg/m   Physical Exam  Constitutional: She appears well-developed and well-nourished. No distress.  HENT:  Head: Normocephalic and atraumatic.  Mouth/Throat: Oropharynx is clear and moist.  Eyes: Conjunctivae are normal.  Cardiovascular: Normal rate, regular rhythm, normal heart sounds and intact distal pulses.  Pulmonary/Chest: Effort normal and breath sounds normal.  Abdominal: Soft. Bowel sounds are normal. She exhibits no distension. There is no tenderness. There is no rebound and no guarding.  Neurological: She is alert.  Skin: Skin is warm.  Psychiatric: She has a normal mood and affect. Her behavior is normal.  Nursing note and vitals reviewed.    ED Treatments / Results  Labs (all labs ordered are listed, but only abnormal results are displayed) Labs Reviewed - No data to display  EKG  EKG Interpretation None       Radiology No results found.  Procedures Procedures (including critical care time)  Medications Ordered in ED Medications  ondansetron (ZOFRAN-ODT) disintegrating tablet 4 mg (4 mg Oral Given 05/03/17 1703)     Initial Impression / Assessment and Plan / ED Course  I have reviewed the triage  vital signs and the nursing notes.  Pertinent labs & imaging results that were available during my care of the patient were reviewed by me and considered in my medical decision making (see chart for details).     Patient with symptoms consistent with viral gastroenteritis; presenting with her son for similar symptoms.  Vitals are stable, no fever.  No signs of dehydration, tolerating PO fluids > 6 oz.  Lungs are clear.  No focal abdominal pain, no concern for appendicitis, cholecystitis, pancreatitis, ruptured viscus, UTI, kidney stone,  or any other abdominal etiology.  Supportive therapy indicated with return if symptoms worsen.  Patient counseled.  Discussed results, findings, treatment and follow up. Patient advised of return precautions. Patient verbalized understanding and agreed with plan.  Final Clinical Impressions(s) / ED Diagnoses   Final diagnoses:  Gastroenteritis    ED Discharge Orders    None       Robinson, [place] N, PA-C 05/03/17 1710    Rolan BuccoBelfi, Melanie, MD 05/03/17 1714

## 2017-05-03 NOTE — ED Triage Notes (Signed)
Vomiting x 2 and diarrhea x5 since last night.

## 2017-05-03 NOTE — ED Notes (Signed)
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

## 2017-05-03 NOTE — ED Notes (Signed)
Pt given gingerale for PO challenge 

## 2017-05-03 NOTE — Discharge Instructions (Addendum)
Please read instructions below. Wash your hands frequently! Drink clear liquids until your stomach feels better. Then, slowly introduce bland foods into your diet as tolerated, such as bread, rice, apples, bananas. You can take zofran every 8 hours as needed for nausea. Follow up with your primary care if symptoms persist. Return to the ER for severe abdominal pain, fever, uncontrollable vomiting, or new or concerning symptoms.

## 2017-11-14 IMAGING — CR DG FOOT COMPLETE 3+V*L*
3 series · 3 of 3 positions shown · non-contrast
Comparison: None.

CLINICAL DATA: Foot injury today.

EXAM:
LEFT FOOT - COMPLETE 3+ VIEW

[t foot ap left]
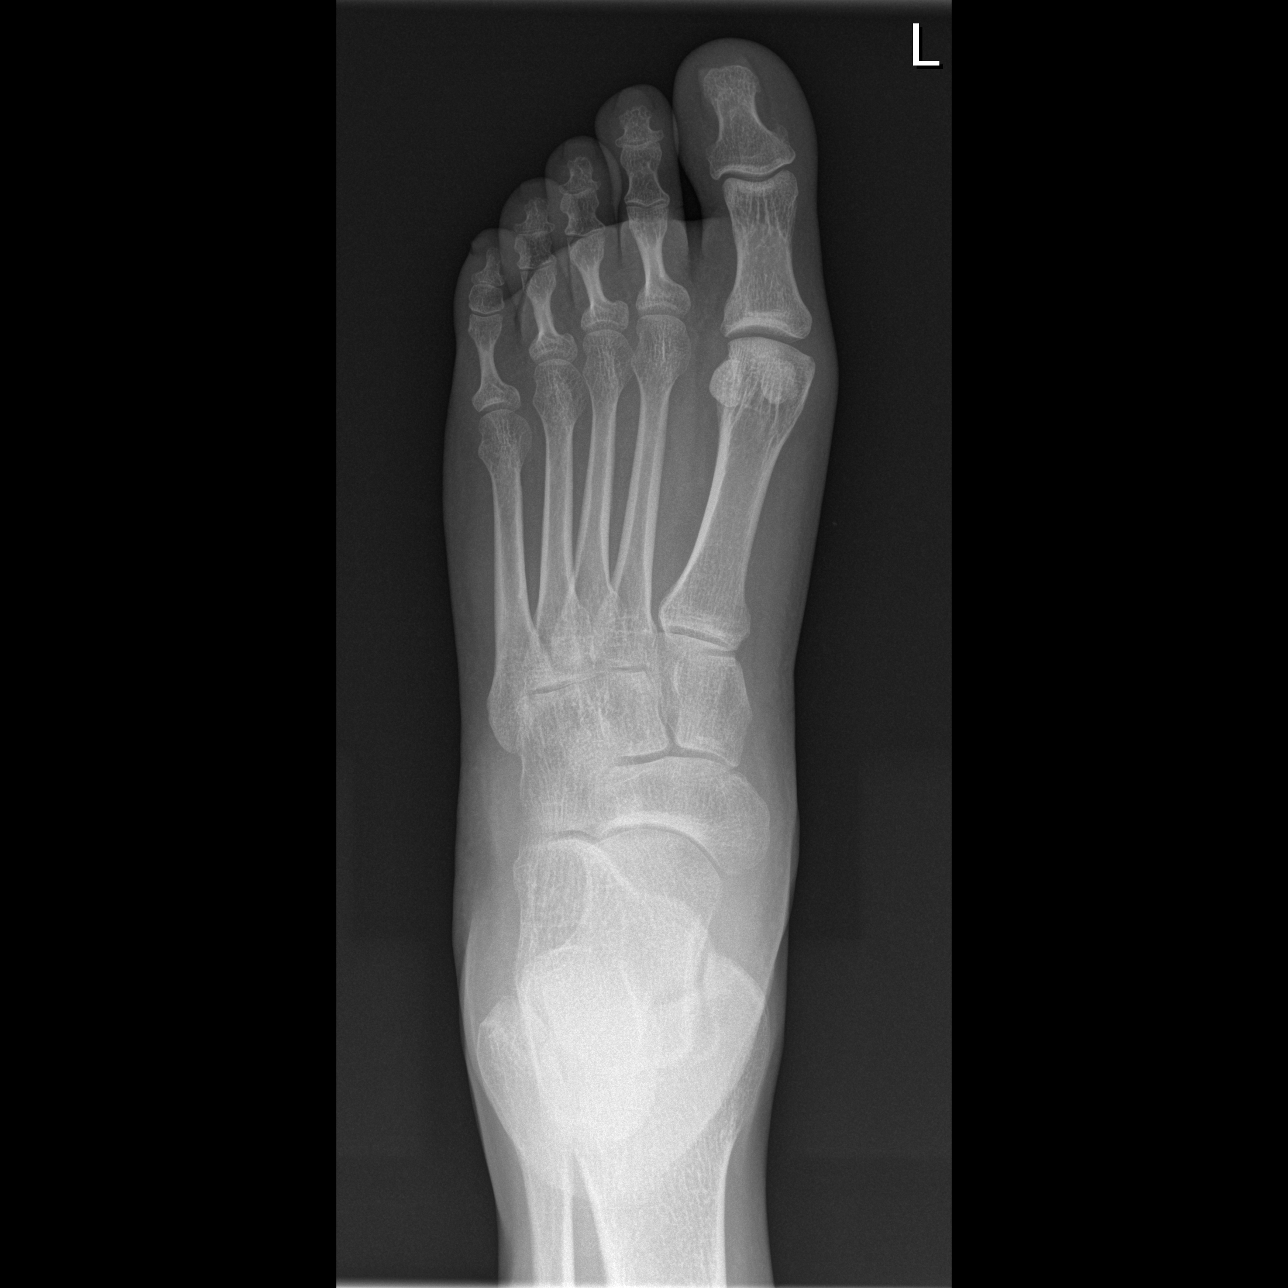

[t foot oblique left]
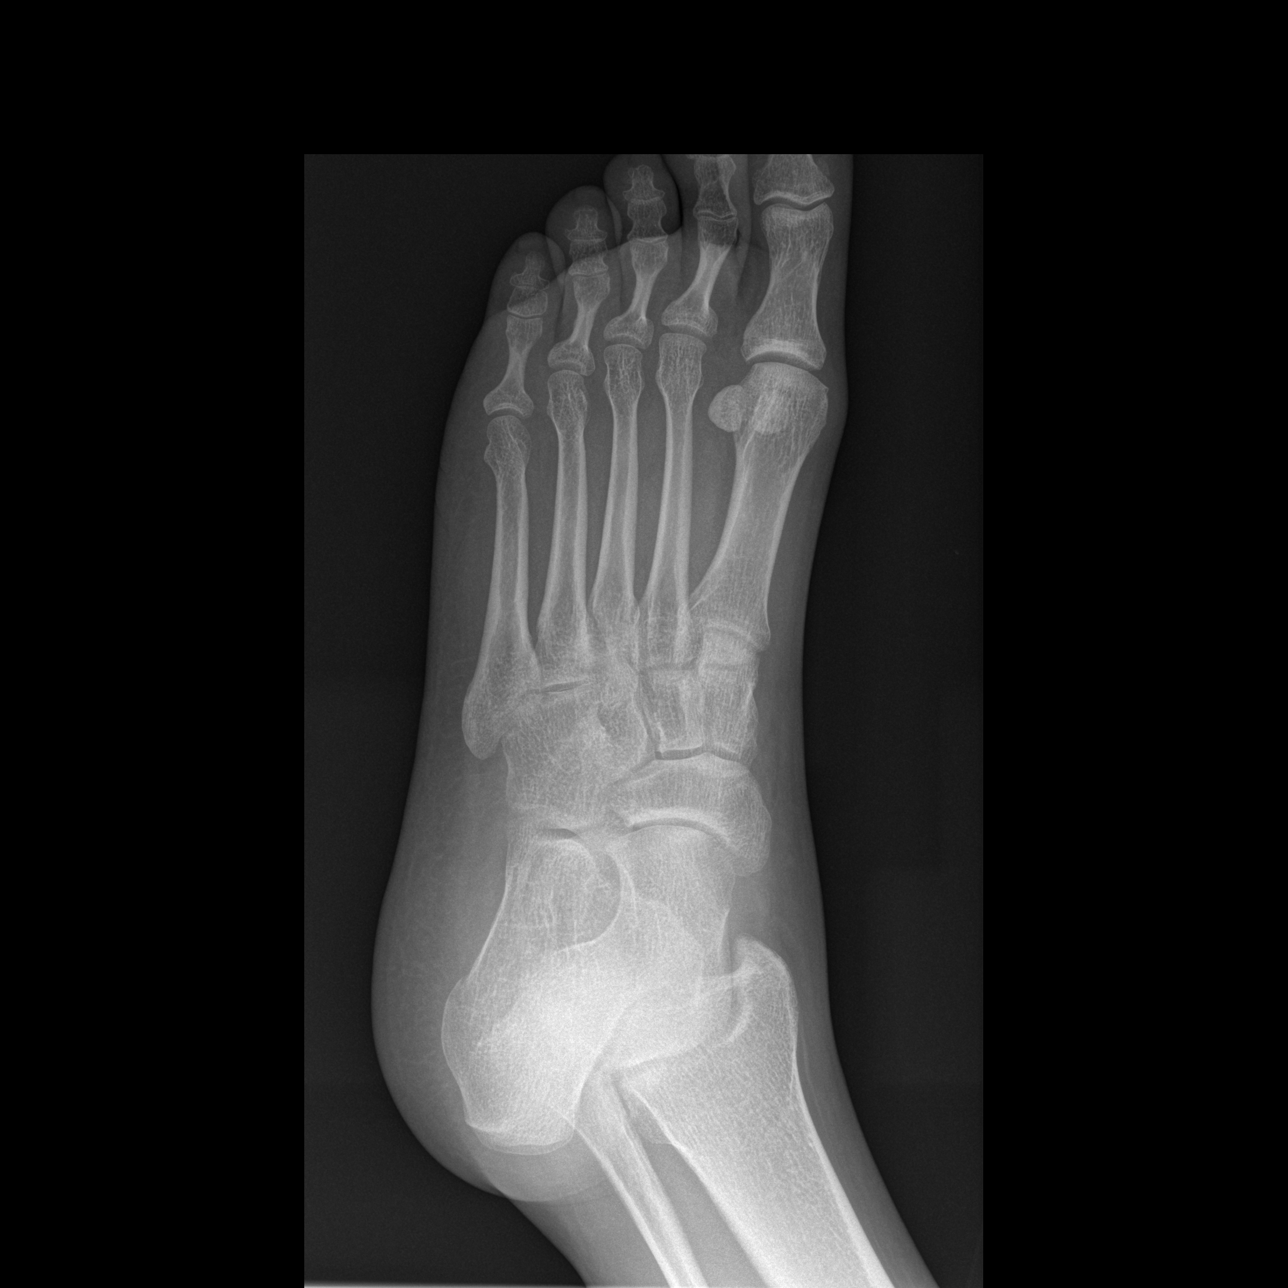

[t foot lat left]
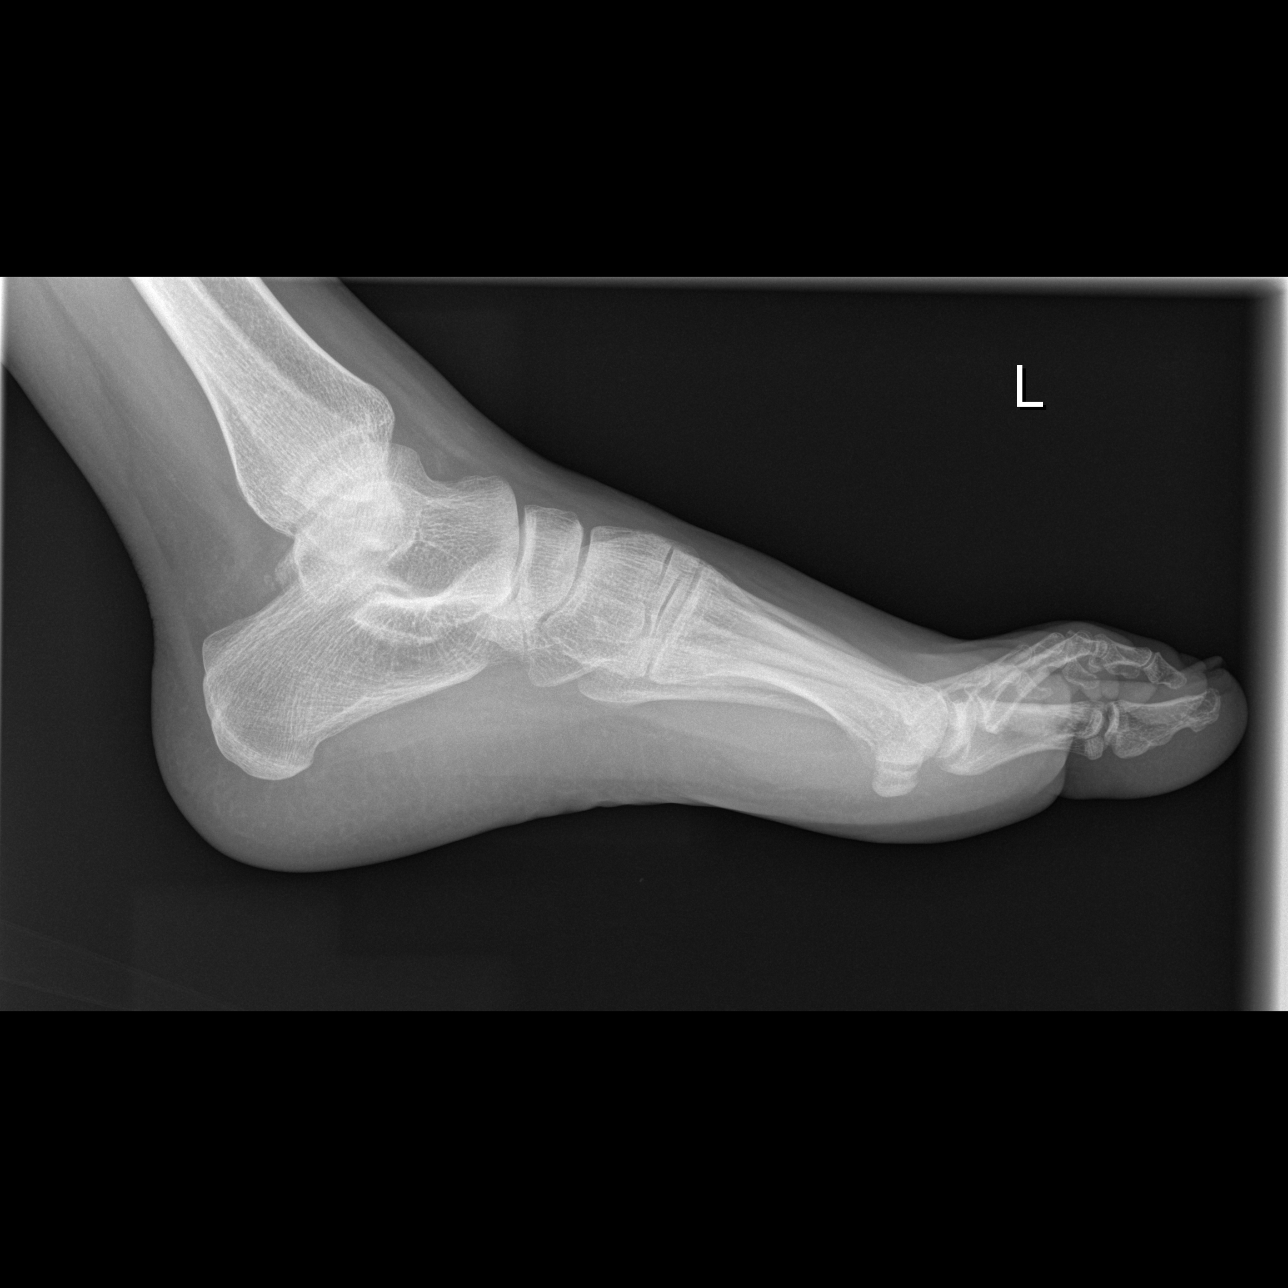

[3 of 3 positions shown; findings below may reference images not displayed]

FINDINGS: There is no evidence of fracture or dislocation. There is no
evidence of arthropathy or other focal bone abnormality. Soft
tissues are unremarkable.
IMPRESSION: Negative.

## 2022-11-22 ENCOUNTER — Ambulatory Visit
Admission: EM | Admit: 2022-11-22 | Discharge: 2022-11-22 | Disposition: A | Discharge status: Home / Self Care | Payer: Medicaid Other | Attending: Emergency Medicine | Admitting: Emergency Medicine

## 2022-11-22 DIAGNOSIS — L209 Atopic dermatitis, unspecified: Secondary | ICD-10-CM

## 2022-11-22 MED ORDER — TRIAMCINOLONE ACETONIDE 0.025 % EX OINT: 1.0000 | TOPICAL_OINTMENT | Freq: Two times a day (BID) | CUTANEOUS | 2 refills | Status: AC

## 2022-11-22 NOTE — Discharge Instructions (Signed)
Today you were evaluated for your eczema which was most likely flared due to weather change and allergy season  You may use triamcinolone cream twice daily over the affected areas until rash has cleared  For itch it is a for you to use allergy medicine such as Claritin Zyrtec or Allegra, whichever one you use is a  personal choice, may also use Benadryl but this may make you feel drowsy, he also may use topical medicine such as Benadryl cream or calamine lotion as an alternative  Ensure that you are moisturizing your skin daily with an emollient such as Vaseline or similar product  Ensure that you are limiting exposure to water to 10 to 15 minutes during bathing as long exposure will cause dryness to the skin which will make rash worse  If you have any further concerns you may follow-up with this urgent care as needed for further evaluation

## 2022-11-22 NOTE — ED Provider Notes (Signed)
Renaldo Fiddler    CSN: [id number] Arrival date & time: 11/22/22  1116      History   Chief Complaint Chief Complaint  Patient presents with   Eczema    HPI Delesha Pohlman is a 37 y.o. female.   Patient presents for evaluation of flesh tone.  Rash present to the chest and neck beginning 3 days ago.  Known history of eczema, currently does not have topical medication.  Unsure of trigger.  Recently gave birth via cesarean 2 weeks ago.  Currently breast-feeding.  Denies drainage or fever.  Denies change in toiletries, diet or recent travel.  No past medical history on file.  There are no problems to display for this patient.   Past Surgical History:  Procedure Laterality Date   CESAREAN SECTION      OB History   No obstetric history on file.      Home Medications    Prior to Admission medications   Medication Sig Start Date End Date Taking? Authorizing Provider  triamcinolone (KENALOG) 0.025 % ointment Apply 1 Application topically 2 (two) times daily. 11/22/22  Yes Kori Colin R, NP  ibuprofen (ADVIL,MOTRIN) 800 MG tablet Take 1 tablet (800 mg total) by mouth every 8 (eight) hours as needed. 12/12/15   Mesner, Barbara Cower, MD  ondansetron (ZOFRAN ODT) 4 MG disintegrating tablet Take 1 tablet (4 mg total) by mouth every 8 (eight) hours as needed for nausea or vomiting. 05/03/17   Robinson, [place] N, PA-C    Family History No family history on file.  Social History Social History   Tobacco Use   Smoking status: Every Day   Smokeless tobacco: Never  Substance Use Topics   Alcohol use: Yes    Comment: occ   Drug use: No     Allergies   Patient has no known allergies.   Review of Systems Review of Systems   Physical Exam Triage Vital Signs ED Triage Vitals [11/22/22 1244]  Encounter Vitals Group     BP 112/74     Systolic BP Percentile      Diastolic BP Percentile      Pulse Rate 79     Resp 17     Temp 99.2 F (37.3 C)     Temp Source  Oral     SpO2 94 %     Weight      Height      Head Circumference      Peak Flow      Pain Score      Pain Loc      Pain Education      Exclude from Growth Chart    No data found.  Updated Vital Signs BP 112/74 (BP Location: Left Arm)   Pulse 79   Temp 99.2 F (37.3 C) (Oral)   Resp 17   SpO2 94%   Visual Acuity Right Eye Distance:   Left Eye Distance:   Bilateral Distance:    Right Eye Near:   Left Eye Near:    Bilateral Near:     Physical Exam Constitutional:      Appearance: Normal appearance.  Eyes:     Extraocular Movements: Extraocular movements intact.  Pulmonary:     Effort: Pulmonary effort is normal.  Skin:    Comments: Fine papular eczematous rash present across the anterior chest wall and anterior of the neck  Neurological:     Mental Status: She is alert and oriented to person, place, and time. Mental  status is at baseline.      UC Treatments / Results  Labs (all labs ordered are listed, but only abnormal results are displayed) Labs Reviewed - No data to display  EKG   Radiology No results found.  Procedures Procedures (including critical care time)  Medications Ordered in UC Medications - No data to display  Initial Impression / Assessment and Plan / UC Course  I have reviewed the triage vital signs and the nursing notes.  Pertinent labs & imaging results that were available during my care of the patient were reviewed by me and considered in my medical decision making (see chart for details).  Atopic dermatitis  Presentation consistent with eczema, known history, has used triamcinolone per patient in the past with success, refilled, recommended use of daily antihistamine as we have moved into allergy season and this is most likely the trigger, discussed this with patient, recommended additional supportive care with follow-up as needed Final Clinical Impressions(s) / UC Diagnoses   Final diagnoses:  Atopic dermatitis, unspecified  type     Discharge Instructions      Today you were evaluated for your eczema which was most likely flared due to weather change and allergy season  You may use triamcinolone cream twice daily over the affected areas until rash has cleared  For itch it is a for you to use allergy medicine such as Claritin Zyrtec or Allegra, whichever one you use is a  personal choice, may also use Benadryl but this may make you feel drowsy, he also may use topical medicine such as Benadryl cream or calamine lotion as an alternative  Ensure that you are moisturizing your skin daily with an emollient such as Vaseline or similar product  Ensure that you are limiting exposure to water to 10 to 15 minutes during bathing as long exposure will cause dryness to the skin which will make rash worse  If you have any further concerns you may follow-up with this urgent care as needed for further evaluation   ED Prescriptions     Medication Sig Dispense Auth. Provider   triamcinolone (KENALOG) 0.025 % ointment Apply 1 Application topically 2 (two) times daily. 30 g Valinda Hoar, NP      PDMP not reviewed this encounter.   Valinda Hoar, [place] 11/22/22 1609

## 2022-11-22 NOTE — ED Triage Notes (Signed)
Seen  by provider only

## 2023-08-08 ENCOUNTER — Ambulatory Visit
Admission: EM | Admit: 2023-08-08 | Discharge: 2023-08-08 | Disposition: A | Discharge status: Home / Self Care | Attending: Emergency Medicine | Admitting: Emergency Medicine

## 2023-08-08 DIAGNOSIS — N941 Unspecified dyspareunia: Secondary | ICD-10-CM | POA: Diagnosis present

## 2023-08-08 DIAGNOSIS — R102 Pelvic and perineal pain: Secondary | ICD-10-CM | POA: Insufficient documentation

## 2023-08-08 LAB — POCT URINALYSIS DIP (MANUAL ENTRY)
Bilirubin, UA: NEGATIVE
Glucose, UA: NEGATIVE mg/dL
Ketones, POC UA: NEGATIVE mg/dL
Nitrite, UA: NEGATIVE
Protein Ur, POC: NEGATIVE mg/dL
Spec Grav, UA: 1.025 (ref 1.010–1.025)
Urobilinogen, UA: 0.2 U/dL
pH, UA: 6 (ref 5.0–8.0)

## 2023-08-08 LAB — POCT URINE PREGNANCY: Preg Test, Ur: NEGATIVE

## 2023-08-08 MED ORDER — FLUCONAZOLE 150 MG PO TABS: 150.0000 mg | ORAL_TABLET | Freq: Once | ORAL | 0 refills | Status: AC

## 2023-08-08 NOTE — ED Triage Notes (Signed)
 Patient to Urgent Care with complaints of vaginal pain that occurred one time last night.    Reports being treated for a yeast infection one week ago. Symptoms resolved.

## 2023-08-08 NOTE — ED Provider Notes (Signed)
 Arlander Bellman    CSN: [id number] Arrival date & time: 08/08/23  0803      History   Chief Complaint Chief Complaint  Patient presents with   Vaginitis    HPI Angelica Sandoval is a 38 y.o. female.  Patient presents with an episode of pain with sexual intercourse last night.  She states it felt like a knife sticking in her.  She has some residual suprapubic discomfort this morning.  She denies fever, chills, dysuria, hematuria, vaginal discharge, nausea, vomiting, diarrhea, constipation.  No OTC medications taken today.  Patient states she did a telehealth visit approximately 1 week ago for vaginal itching and was prescribed one tablet of Diflucan.  The history is provided by the patient and medical records.    History reviewed. No pertinent past medical history.  There are no active problems to display for this patient.   Past Surgical History:  Procedure Laterality Date   CESAREAN SECTION      OB History   No obstetric history on file.      Home Medications    Prior to Admission medications   Medication Sig Start Date End Date Taking? Authorizing Provider  fluconazole (DIFLUCAN) 150 MG tablet Take 1 tablet (150 mg total) by mouth once for 1 dose. 08/08/23 08/08/23 Yes Wellington Half, NP  ibuprofen  (ADVIL ,MOTRIN ) 800 MG tablet Take 1 tablet (800 mg total) by mouth every 8 (eight) hours as needed. 12/12/15   Mesner, Reymundo Caulk, MD  ondansetron  (ZOFRAN  ODT) 4 MG disintegrating tablet Take 1 tablet (4 mg total) by mouth every 8 (eight) hours as needed for nausea or vomiting. 05/03/17   Robinson, Jordan N, PA-C  triamcinolone  (KENALOG ) 0.025 % ointment Apply 1 Application topically 2 (two) times daily. 11/22/22   Reena Canning, NP    Family History History reviewed. No pertinent family history.  Social History Social History   Tobacco Use   Smoking status: Former    Types: Cigarettes   Smokeless tobacco: Never  Vaping Use   Vaping status: Never Used  Substance  Use Topics   Alcohol use: Yes    Comment: occ   Drug use: No     Allergies   Patient has no known allergies.   Review of Systems Review of Systems  Constitutional:  Negative for chills and fever.  Gastrointestinal:  Positive for abdominal pain. Negative for constipation, diarrhea, nausea and vomiting.  Genitourinary:  Positive for pelvic pain. Negative for dysuria, flank pain, hematuria, vaginal bleeding and vaginal discharge.     Physical Exam Triage Vital Signs ED Triage Vitals [08/08/23 0808]  Encounter Vitals Group     BP      Girls Systolic BP Percentile      Girls Diastolic BP Percentile      Boys Systolic BP Percentile      Boys Diastolic BP Percentile      Pulse      Resp      Temp      Temp src      SpO2      Weight      Height      Head Circumference      Peak Flow      Pain Score 0     Pain Loc      Pain Education      Exclude from Growth Chart    No data found.  Updated Vital Signs BP 130/89   Pulse 72   Temp 97.9 F (36.6  C)   Resp 16   LMP 07/29/2023   SpO2 98%   Visual Acuity Right Eye Distance:   Left Eye Distance:   Bilateral Distance:    Right Eye Near:   Left Eye Near:    Bilateral Near:     Physical Exam Exam conducted with a chaperone present Alston Jerry, Charity fundraiser).  Constitutional:      General: She is not in acute distress. HENT:     Mouth/Throat:     Mouth: Mucous membranes are moist.   Cardiovascular:     Rate and Rhythm: Normal rate and regular rhythm.  Pulmonary:     Effort: Pulmonary effort is normal. No respiratory distress.  Abdominal:     Palpations: Abdomen is soft.     Tenderness: There is no abdominal tenderness. There is no right CVA tenderness, left CVA tenderness, guarding or rebound.  Genitourinary:    General: Normal vulva.     Exam position: Lithotomy position.     Labia:        Right: No rash, tenderness or lesion.        Left: No rash, tenderness or lesion.      Vagina: Vaginal discharge present.      Cervix: Normal. No cervical motion tenderness.     Uterus: Normal.      Adnexa: Right adnexa normal and left adnexa normal.     Comments: Small amount of white vaginal discharge.  Neurological:     Mental Status: She is alert.      UC Treatments / Results  Labs (all labs ordered are listed, but only abnormal results are displayed) Labs Reviewed  POCT URINALYSIS DIP (MANUAL ENTRY) - Abnormal; Notable for the following components:      Result Value   Clarity, UA turbid (*)    Blood, UA small (*)    Leukocytes, UA Small (1+) (*)    All other components within normal limits  URINE CULTURE  POCT URINE PREGNANCY  CERVICOVAGINAL ANCILLARY ONLY    EKG   Radiology No results found.  Procedures Procedures (including critical care time)  Medications Ordered in UC Medications - No data to display  Initial Impression / Assessment and Plan / UC Course  I have reviewed the triage vital signs and the nursing notes.  Pertinent labs & imaging results that were available during my care of the patient were reviewed by me and considered in my medical decision making (see chart for details).    Suprapubic pain, dyspareunia.  Afebrile and vital signs are stable.  Abdomen is soft and nontender.  No cervical motion tenderness.  Small amount of white vaginal discharge.  Treating with 1 dose of Diflucan today as patient took 1 dose of Diflucan approximately 1 week ago.  Urine culture pending.  Vaginal cytology pending.  Instructed patient to abstain from sexual activity until all test results are back and treatment is completed if needed.  Discussed that her test results will be available on her MyChart account and that we will call if treatment is needed.  Instructed her to follow-up with her PCP or gynecologist tomorrow.  ED precautions given.  Education provided on pelvic pain and dyspareunia.  She agrees to plan of care.  Final Clinical Impressions(s) / UC Diagnoses   Final diagnoses:   Suprapubic pain  Dyspareunia in female     Discharge Instructions      Follow up with your primary care provider or gynecologist tomorrow.  Go to the emergency department if you  have worsening symptoms.    Urine culture is pending.  Your vaginal test are pending.    Take the Diflucan as directed.   Do not have sexual activity until all test results are back and, if needed, treatment is completed.    All of your test results will be available in your MyChart.  We will call you if treatment is needed.     ED Prescriptions     Medication Sig Dispense Auth. Provider   fluconazole (DIFLUCAN) 150 MG tablet Take 1 tablet (150 mg total) by mouth once for 1 dose. 1 tablet Wellington Half, NP      PDMP not reviewed this encounter.   Wellington Half, NP 08/08/23 [phone]

## 2023-08-08 NOTE — Discharge Instructions (Addendum)
 Follow up with your primary care provider or gynecologist tomorrow.  Go to the emergency department if you have worsening symptoms.    Urine culture is pending.  Your vaginal test are pending.    Take the Diflucan as directed.   Do not have sexual activity until all test results are back and, if needed, treatment is completed.    All of your test results will be available in your MyChart.  We will call you if treatment is needed.

## 2023-08-09 ENCOUNTER — Ambulatory Visit (HOSPITAL_COMMUNITY): Payer: Self-pay

## 2023-08-09 LAB — CERVICOVAGINAL ANCILLARY ONLY
Bacterial Vaginitis (gardnerella): POSITIVE — AB
Candida Glabrata: NEGATIVE
Candida Vaginitis: NEGATIVE
Chlamydia: NEGATIVE
Comment: NEGATIVE
Comment: NEGATIVE
Comment: NEGATIVE
Comment: NEGATIVE
Comment: NEGATIVE
Comment: NORMAL
Neisseria Gonorrhea: NEGATIVE
Trichomonas: POSITIVE — AB

## 2023-08-09 LAB — URINE CULTURE: Culture: NO GROWTH

## 2023-08-09 MED ORDER — METRONIDAZOLE 500 MG PO TABS: 500.0000 mg | ORAL_TABLET | Freq: Two times a day (BID) | ORAL | 0 refills | Status: AC
